# Patient Record
Sex: Female | Born: 1965 | Race: White | Hispanic: No | Marital: Married | Smoking: Current every day smoker
Health system: Southern US, Community
[De-identification: ages and names within clinical notes are randomized; demographics above are authoritative.]

## PROBLEM LIST (undated history)

## (undated) DIAGNOSIS — G43909 Migraine, unspecified, not intractable, without status migrainosus: Secondary | ICD-10-CM

## (undated) HISTORY — PX: ABDOMINAL HYSTERECTOMY: SHX81

## (undated) HISTORY — DX: Migraine, unspecified, not intractable, without status migrainosus: G43.909

---

## 1992-03-24 HISTORY — PX: KNEE ARTHROSCOPY: SUR90

## 2014-10-04 ENCOUNTER — Ambulatory Visit (HOSPITAL_COMMUNITY)
Admission: RE | Admit: 2014-10-04 | Discharge: 2014-10-04 | Disposition: A | Discharge status: Home / Self Care | Payer: BLUE CROSS/BLUE SHIELD | Source: Ambulatory Visit | Attending: Family Medicine | Admitting: Family Medicine

## 2014-10-04 ENCOUNTER — Ambulatory Visit (INDEPENDENT_AMBULATORY_CARE_PROVIDER_SITE_OTHER): Payer: BLUE CROSS/BLUE SHIELD | Admitting: Family Medicine

## 2014-10-04 ENCOUNTER — Encounter: Payer: Self-pay | Admitting: Family Medicine

## 2014-10-04 ENCOUNTER — Other Ambulatory Visit: Payer: Self-pay | Admitting: Family Medicine

## 2014-10-04 ENCOUNTER — Encounter (INDEPENDENT_AMBULATORY_CARE_PROVIDER_SITE_OTHER): Payer: Self-pay

## 2014-10-04 VITALS — BP 108/78 | HR 78 | Temp 97.5°F | Ht 65.0 in | Wt 196.0 lb

## 2014-10-04 DIAGNOSIS — Z1231 Encounter for screening mammogram for malignant neoplasm of breast: Secondary | ICD-10-CM | POA: Insufficient documentation

## 2014-10-04 DIAGNOSIS — G43009 Migraine without aura, not intractable, without status migrainosus: Secondary | ICD-10-CM

## 2014-10-04 DIAGNOSIS — Z0000 Encounter for general adult medical examination without abnormal findings: Secondary | ICD-10-CM | POA: Diagnosis not present

## 2014-10-04 DIAGNOSIS — E2839 Other primary ovarian failure: Secondary | ICD-10-CM

## 2014-10-04 DIAGNOSIS — Z23 Encounter for immunization: Secondary | ICD-10-CM

## 2014-10-04 LAB — POCT CBC
GRANULOCYTE PERCENT: 66.3 % (ref 37–80)
HEMATOCRIT: 41.9 % (ref 37.7–47.9)
Hemoglobin: 14.2 g/dL (ref 12.2–16.2)
Lymph, poc: 2.6 (ref 0.6–3.4)
MCH, POC: 30.8 pg (ref 27–31.2)
MCHC: 33.9 g/dL (ref 31.8–35.4)
MCV: 90.8 fL (ref 80–97)
MPV: 8.6 fL (ref 0–99.8)
POC Granulocyte: 6.2 (ref 2–6.9)
POC LYMPH PERCENT: 27.8 %L (ref 10–50)
Platelet Count, POC: 205 10*3/uL (ref 142–424)
RBC: 4.62 M/uL (ref 4.04–5.48)
RDW, POC: 13.1 %
WBC: 9.4 10*3/uL (ref 4.6–10.2)

## 2014-10-04 NOTE — Progress Notes (Signed)
Subjective:  Patient ID: Dana Zamora, female    DOB: 1965/10/02  Age: 49 y.o. MRN: [id number]  CC: Establish Care   HPI [name] presents for 6 mos increasing hot flashes. Now are monsters. Using OTC Amberen. No relief. Gets sweats that wake her and drench her. Feel like she is on fire. Somewhat emotional, but denies depression. Would like treatment guidance. Realizes that hormonal tx is unsafe due to smoking history. Will ing to get mammogram for screening. Apparently hyst left ovary which has recently begun to fail.   Additionally pt. Suffers from migraines. Maxalt is sufficient abortive. HA 2-4 per month. 7/10 pain is throbbing. Frontoparietal. Some photophobia and phonophobia without nausea.Past w/u neg for intracranial lesion. Not taking preventive. Has tried a couple of meds. Not sure of name but has not tried topiramate.   History [name] has a past medical history of Migraine.   She has past surgical history that includes Abdominal hysterectomy and Knee arthroscopy (Left, 03/24/1992).   Her family history includes Cancer in her mother.She reports that she has been smoking Cigarettes.  She started smoking about 30 years ago. She has been smoking about 0.50 packs per day. She does not have any smokeless tobacco history on file. She reports that she drinks alcohol. She reports that she does not use illicit drugs.    ROS Review of Systems  Constitutional: Negative for fever, chills, diaphoresis, appetite change, fatigue and unexpected weight change.  HENT: Negative for congestion, ear pain, hearing loss, postnasal drip, rhinorrhea, sneezing, sore throat and trouble swallowing.   Eyes: Negative for pain.  Respiratory: Negative for cough, chest tightness and shortness of breath.   Cardiovascular: Negative for chest pain and palpitations.  Gastrointestinal: Negative for nausea, vomiting, abdominal pain, diarrhea, constipation and blood in stool.  Genitourinary: Positive for  menstrual problem. Negative for dysuria, urgency, frequency, vaginal bleeding, vaginal discharge, pelvic pain and dyspareunia.  Musculoskeletal: Negative for joint swelling and arthralgias.  Skin: Negative for rash.  Allergic/Immunologic: Negative for environmental allergies, food allergies and immunocompromised state.  Neurological: Negative for dizziness, weakness, numbness and headaches.  Hematological: Negative for adenopathy. Does not bruise/bleed easily.  Psychiatric/Behavioral: Negative for dysphoric mood, decreased concentration and agitation. The patient is not nervous/anxious.     Objective:  BP 108/78 mmHg  Pulse 78  Temp(Src) 97.5 F (36.4 C) (Oral)  Ht 5' 5"  (1.651 m)  Wt 196 lb (88.905 kg)  BMI 32.62 kg/m2  Physical Exam  Constitutional: She is oriented to person, place, and time. She appears well-developed and well-nourished. No distress.  HENT:  Head: Normocephalic and atraumatic.  Right Ear: External ear normal.  Left Ear: External ear normal.  Nose: Nose normal.  Mouth/Throat: Oropharynx is clear and moist.  Eyes: Conjunctivae and EOM are normal. Pupils are equal, round, and reactive to light.  Neck: Normal range of motion. Neck supple. No thyromegaly present.  Cardiovascular: Normal rate, regular rhythm, normal heart sounds and intact distal pulses.  Exam reveals no gallop and no friction rub.   No murmur heard. Pulmonary/Chest: Effort normal and breath sounds normal. No tachypnea. No respiratory distress. She has no wheezes. She has no rhonchi. She has no rales. Right breast exhibits no inverted nipple, no mass, no nipple discharge, no skin change and no tenderness. Left breast exhibits no inverted nipple, no mass, no nipple discharge, no skin change and no tenderness. Breasts are symmetrical.  Abdominal: Soft. Bowel sounds are normal. She exhibits no distension and no mass. There is no  tenderness. There is no rebound and no guarding.  Musculoskeletal: Normal range  of motion. She exhibits no edema or tenderness.  Lymphadenopathy:    She has no cervical adenopathy.  Neurological: She is alert and oriented to person, place, and time. She has normal reflexes.  Skin: Skin is warm and dry.  Psychiatric: She has a normal mood and affect. Her behavior is normal. Judgment and thought content normal.    Assessment & Plan:   Shelsy was seen today for establish care.  Diagnoses and all orders for this visit:  Wellness examination Orders: -     POCT CBC -     CMP14+EGFR -     Lipid panel -     Thyroid Panel With TSH -     Vit D  25 hydroxy (rtn osteoporosis monitoring)  Menopause ovarian failure  Migraine without aura and without status migrainosus, not intractable  Other orders -     Tdap vaccine greater than or equal to 7yo IM   I am having Ms. Frogge maintain her rizatriptan and topiramate.  Meds ordered this encounter  Medications  . rizatriptan (MAXALT) 10 MG tablet    Sig: Take 10 mg by mouth as needed for migraine. May repeat in 2 hours if needed  . topiramate (TOPAMAX) 25 MG tablet    Sig: Take 25 mg by mouth 2 (two) times daily.   Pt. Was counseled regarding smoking cessation. Pharmacologic and behavioral techniques reviewed. Risks to health, specifically to breast cancer, but also heart disease, COPD, CA reviewed.  Auto safety including seat belt use & driving after drinking reviewed.  Proper diet & sleep plus OTC menopause meds discussed.  Follow-up: Return in about 3 months (around 01/04/2015).  Claretta Fraise, M.D.

## 2014-10-05 LAB — LIPID PANEL
CHOLESTEROL TOTAL: 283 mg/dL — AB (ref 100–199)
Chol/HDL Ratio: 5 ratio units — ABNORMAL HIGH (ref 0.0–4.4)
HDL: 57 mg/dL (ref >39)
LDL Calculated: 200 mg/dL — ABNORMAL HIGH (ref 0–99)
Triglycerides: 128 mg/dL (ref 0–149)
VLDL CHOLESTEROL CAL: 26 mg/dL (ref 5–40)

## 2014-10-05 LAB — CMP14+EGFR
ALT: 17 IU/L (ref 0–32)
AST: 16 IU/L (ref 0–40)
Albumin/Globulin Ratio: 2 (ref 1.1–2.5)
Albumin: 4.3 g/dL (ref 3.5–5.5)
Alkaline Phosphatase: 50 IU/L (ref 39–117)
BUN / CREAT RATIO: 22 (ref 9–23)
BUN: 19 mg/dL (ref 6–24)
Bilirubin Total: 0.3 mg/dL (ref 0.0–1.2)
CO2: 20 mmol/L (ref 18–29)
Calcium: 9.2 mg/dL (ref 8.7–10.2)
Chloride: 106 mmol/L (ref 97–108)
Creatinine, Ser: 0.87 mg/dL (ref 0.57–1.00)
GFR calc Af Amer: 90 mL/min/{1.73_m2} (ref >59)
GFR, EST NON AFRICAN AMERICAN: 78 mL/min/{1.73_m2} (ref >59)
Globulin, Total: 2.2 g/dL (ref 1.5–4.5)
Glucose: 87 mg/dL (ref 65–99)
Potassium: 4.4 mmol/L (ref 3.5–5.2)
Sodium: 143 mmol/L (ref 134–144)
Total Protein: 6.5 g/dL (ref 6.0–8.5)

## 2014-10-05 LAB — THYROID PANEL WITH TSH
Free Thyroxine Index: 2 (ref 1.2–4.9)
T3 UPTAKE RATIO: 26 % (ref 24–39)
T4 TOTAL: 7.7 ug/dL (ref 4.5–12.0)
TSH: 1.43 u[IU]/mL (ref 0.450–4.500)

## 2014-10-05 LAB — VITAMIN D 25 HYDROXY (VIT D DEFICIENCY, FRACTURES): Vit D, 25-Hydroxy: 23.9 ng/mL — ABNORMAL LOW (ref 30.0–100.0)

## 2014-10-06 ENCOUNTER — Telehealth: Payer: Self-pay | Admitting: *Deleted

## 2014-10-06 MED ORDER — ATORVASTATIN CALCIUM 40 MG PO TABS: 40.0000 mg | ORAL_TABLET | Freq: Every day | ORAL | Status: DC

## 2014-10-06 NOTE — Telephone Encounter (Signed)
-----   Message from Mechele ClaudeWarren Stacks, MD sent at 10/05/2014 12:26 PM EDT ----- Your cholesterol is too high. It would benefit from medication. Without treatment, risk for heart attack, stroke and other medical conditions is high. I recommend atorvastatin. 40 mg daily with supper should reduce your risk significantly.  Nurse: please send in a prescription to pt.s pharmacy, if they are willing, for atorvastatin 40 mg. One daily.  6 mos supply.  Make sure they are set up for a followup in 6 mos. Ask them to come 2 days early for fasting NMR and CMP.

## 2014-10-06 NOTE — Telephone Encounter (Signed)
Pt notified of results Verbalizes understanding 

## 2014-10-20 ENCOUNTER — Telehealth: Payer: Self-pay | Admitting: Family Medicine

## 2014-10-20 MED ORDER — CONJ ESTROG-MEDROXYPROGEST ACE 0.3-1.5 MG PO TABS: 1.0000 | ORAL_TABLET | Freq: Every day | ORAL | Status: DC

## 2014-10-20 NOTE — Telephone Encounter (Signed)
Done, tell pt. 

## 2014-10-20 NOTE — Telephone Encounter (Signed)
Please advise and route to Pool B 

## 2014-10-24 NOTE — Telephone Encounter (Signed)
Detailed Message left.

## 2014-12-11 ENCOUNTER — Other Ambulatory Visit: Payer: Self-pay

## 2014-12-11 MED ORDER — CONJ ESTROG-MEDROXYPROGEST ACE 0.3-1.5 MG PO TABS: 1.0000 | ORAL_TABLET | Freq: Every day | ORAL | Status: DC

## 2015-03-14 ENCOUNTER — Other Ambulatory Visit: Payer: Self-pay | Admitting: Family Medicine

## 2015-05-07 ENCOUNTER — Other Ambulatory Visit: Payer: Self-pay | Admitting: Family Medicine

## 2015-05-08 NOTE — Telephone Encounter (Signed)
Last seen 10/04/14  Dr Darlyn Read

## 2016-03-10 ENCOUNTER — Ambulatory Visit (INDEPENDENT_AMBULATORY_CARE_PROVIDER_SITE_OTHER): Payer: No Typology Code available for payment source | Admitting: Family Medicine

## 2016-03-10 ENCOUNTER — Encounter: Payer: Self-pay | Admitting: Family Medicine

## 2016-03-10 VITALS — BP 133/92 | HR 81 | Temp 97.7°F | Ht 65.0 in | Wt 200.0 lb

## 2016-03-10 DIAGNOSIS — J0100 Acute maxillary sinusitis, unspecified: Secondary | ICD-10-CM

## 2016-03-10 MED ORDER — BETAMETHASONE SOD PHOS & ACET 6 (3-3) MG/ML IJ SUSP
6.0000 mg | Freq: Once | INTRAMUSCULAR | Status: AC
  Administered 2016-03-10: 6 mg via INTRAMUSCULAR

## 2016-03-10 MED ORDER — PSEUDOEPHEDRINE-GUAIFENESIN ER 120-1200 MG PO TB12: 1.0000 | ORAL_TABLET | Freq: Two times a day (BID) | ORAL | 0 refills | Status: DC

## 2016-03-10 MED ORDER — AMOXICILLIN-POT CLAVULANATE 875-125 MG PO TABS: 1.0000 | ORAL_TABLET | Freq: Two times a day (BID) | ORAL | 0 refills | Status: DC

## 2016-03-10 NOTE — Progress Notes (Signed)
Subjective:  Patient ID: Dana Zamora, female    DOB: [birth year]  Age: 50 y.o. MRN: [id number]  CC: Nasal Congestion (pt here today c/o runny nose, head stopped up)   HPI Dana Zamora presents for Symptoms include congestion, facial pain, nasal congestion, no  fever, non productive cough, post nasal drip and sinus pressure with no fever, chills, night sweats or weight loss. Onset of symptoms was a 3 weeks ago, gradually worsening in spite of use of mucinex & sudafed  History Dana Zamora has a past medical history of Migraine.   She has a past surgical history that includes Abdominal hysterectomy and Knee arthroscopy (Left, 03/24/1992).   Her family history includes Cancer in her mother.She reports that she has been smoking Cigarettes.  She started smoking about 31 years ago. She has been smoking about 0.50 packs per day. She has never used smokeless tobacco. She reports that she drinks alcohol. She reports that she does not use drugs.  Current Outpatient Prescriptions on File Prior to Visit  Medication Sig Dispense Refill  . rizatriptan (MAXALT) 10 MG tablet Take 10 mg by mouth as needed for migraine. May repeat in 2 hours if needed    . topiramate (TOPAMAX) 25 MG tablet Take 25 mg by mouth 2 (two) times daily.     No current facility-administered medications on file prior to visit.     ROS Review of Systems  Constitutional: Negative for activity change, appetite change, chills and fever.  HENT: Positive for congestion, postnasal drip, rhinorrhea and sinus pressure. Negative for ear discharge, ear pain, hearing loss, nosebleeds, sneezing and trouble swallowing.   Respiratory: Negative for chest tightness and shortness of breath.   Cardiovascular: Negative for chest pain and palpitations.  Skin: Negative for rash.    Objective:  BP (!) 133/92   Pulse 81   Temp 97.7 F (36.5 C) (Oral)   Ht 5\' 5"  (1.651 m)   Wt 200 lb (90.7 kg)   BMI 33.28 kg/m   Physical Exam    Constitutional: She appears well-developed and well-nourished.  HENT:  Head: Normocephalic and atraumatic.  Right Ear: Tympanic membrane and external ear normal. No decreased hearing is noted.  Left Ear: Tympanic membrane and external ear normal. No decreased hearing is noted.  Nose: Mucosal edema present. Right sinus exhibits no frontal sinus tenderness. Left sinus exhibits no frontal sinus tenderness.  Mouth/Throat: No oropharyngeal exudate or posterior oropharyngeal erythema.  Neck: No Brudzinski's sign noted.  Pulmonary/Chest: Breath sounds normal. No respiratory distress.  Lymphadenopathy:       Head (right side): No preauricular adenopathy present.       Head (left side): No preauricular adenopathy present.       Right cervical: No superficial cervical adenopathy present.      Left cervical: No superficial cervical adenopathy present.    Assessment & Plan:   Dana Zamora was seen today for nasal congestion.  Diagnoses and all orders for this visit:  Acute maxillary sinusitis, recurrence not specified -     betamethasone acetate-betamethasone sodium phosphate (CELESTONE) injection 6 mg; Inject 1 mL (6 mg total) into the muscle once.  Other orders -     amoxicillin-clavulanate (AUGMENTIN) 875-125 MG tablet; Take 1 tablet by mouth 2 (two) times daily. Take all of this medication -     Pseudoephedrine-Guaifenesin [phone] MG TB12; Take 1 tablet by mouth 2 (two) times daily. For congestion   I have discontinued Ms. Steinke's atorvastatin and PREMPRO. I am also having her start  on amoxicillin-clavulanate and Pseudoephedrine-Guaifenesin. Additionally, I am having her maintain her rizatriptan and topiramate. We will continue to administer betamethasone acetate-betamethasone sodium phosphate.  Meds ordered this encounter  Medications  . amoxicillin-clavulanate (AUGMENTIN) 875-125 MG tablet    Sig: Take 1 tablet by mouth 2 (two) times daily. Take all of this medication    Dispense:  20  tablet    Refill:  0  . Pseudoephedrine-Guaifenesin [phone] MG TB12    Sig: Take 1 tablet by mouth 2 (two) times daily. For congestion    Dispense:  20 each    Refill:  0  . betamethasone acetate-betamethasone sodium phosphate (CELESTONE) injection 6 mg     Follow-up: Return if symptoms worsen or fail to improve.  Mechele ClaudeWarren Laretha Luepke, M.D.

## 2016-05-16 ENCOUNTER — Ambulatory Visit (INDEPENDENT_AMBULATORY_CARE_PROVIDER_SITE_OTHER): Payer: No Typology Code available for payment source | Admitting: Family Medicine

## 2016-05-16 ENCOUNTER — Encounter: Payer: Self-pay | Admitting: Family Medicine

## 2016-05-16 VITALS — BP 125/85 | HR 96 | Temp 98.7°F | Ht 65.0 in | Wt 199.4 lb

## 2016-05-16 DIAGNOSIS — R6889 Other general symptoms and signs: Secondary | ICD-10-CM | POA: Diagnosis not present

## 2016-05-16 LAB — VERITOR FLU A/B WAIVED
INFLUENZA B: NEGATIVE
Influenza A: NEGATIVE

## 2016-05-16 MED ORDER — OSELTAMIVIR PHOSPHATE 75 MG PO CAPS: 75.0000 mg | ORAL_CAPSULE | Freq: Two times a day (BID) | ORAL | 0 refills | Status: AC

## 2016-05-16 NOTE — Progress Notes (Signed)
BP 125/85   Pulse 96   Temp 98.7 F (37.1 C) (Oral)   Ht 5\' 5"  (1.651 m)   Wt 199 lb 6.4 oz (90.4 kg)   BMI 33.18 kg/m    Subjective:    Patient ID: Dana Zamora, female    DOB: 09/25/1965, 51 y.o.   MRN: [id number]  HPI: Dana Zamora is a 51 y.o. female presenting on 05/16/2016 for Nasal Congestion; Generalized Body Aches; and Fever   HPI Cough and congestion and body aches and fever Patient has been having cough and congestion and body aches and fever this been going on since last night. She denies any shortness of breath or wheezing. Her cough has been mostly nonproductive at this point. She does have some nasal congestion and sinus pressure as well. She has not really used anything over-the-counter for this just yet except Tylenol for fever. She says her fever was low-grade at 99 last night. 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  Relevant past medical, surgical, family and social history reviewed and updated as indicated. Interim medical history since our last visit reviewed. Allergies and medications reviewed and updated.  Review of Systems  Constitutional: Positive for chills and fever.  HENT: Positive for congestion, postnasal drip, rhinorrhea, sinus pressure and sore throat. Negative for ear discharge, ear pain and sneezing.   Eyes: Negative for pain, redness and visual disturbance.  Respiratory: Positive for cough. Negative for chest tightness and shortness of breath.   Cardiovascular: Negative for chest pain and leg swelling.  Genitourinary: Negative for difficulty urinating and dysuria.  Musculoskeletal: Positive for myalgias. Negative for back pain and gait problem.  Skin: Negative for rash.  Neurological: Negative for light-headedness and headaches.  Psychiatric/Behavioral: Negative for agitation and behavioral problems.  All other systems reviewed and are negative.   Per HPI unless  specifically indicated above   Allergies as of 05/16/2016   No Known Allergies     Medication List       Accurate as of 05/16/16  5:57 PM. Always use your most recent med list.          oseltamivir 75 MG capsule Commonly known as:  TAMIFLU Take 1 capsule (75 mg total) by mouth 2 (two) times daily.   rizatriptan 10 MG tablet Commonly known as:  MAXALT Take 10 mg by mouth as needed for migraine. May repeat in 2 hours if needed   topiramate 25 MG tablet Commonly known as:  TOPAMAX Take 25 mg by mouth 2 (two) times daily.          Objective:    BP 125/85   Pulse 96   Temp 98.7 F (37.1 C) (Oral)   Ht 5\' 5"  (1.651 m)   Wt 199 lb 6.4 oz (90.4 kg)   BMI 33.18 kg/m   Wt Readings from Last 3 Encounters:  05/16/16 199 lb 6.4 oz (90.4 kg)  03/10/16 200 lb (90.7 kg)  10/04/14 196 lb (88.9 kg)    Physical Exam  Constitutional: She is oriented to person, place, and time. She appears well-developed and well-nourished. No distress.  HENT:  Right Ear: Tympanic membrane, external ear and ear canal normal.  Left Ear: Tympanic membrane, external ear and ear canal normal.  Nose: Mucosal edema and rhinorrhea present. No epistaxis. Right sinus exhibits no maxillary sinus tenderness and no frontal sinus tenderness. Left sinus exhibits no maxillary sinus tenderness and no frontal  sinus tenderness.  Mouth/Throat: Uvula is midline and mucous membranes are normal. Posterior oropharyngeal edema and posterior oropharyngeal erythema present. No oropharyngeal exudate or tonsillar abscesses.  Eyes: Conjunctivae are normal.  Cardiovascular: Normal rate, regular rhythm, normal heart sounds and intact distal pulses.   No murmur heard. Pulmonary/Chest: Effort normal and breath sounds normal. No respiratory distress. She has no wheezes. She has no rales.  Musculoskeletal: Normal range of motion. She exhibits no edema or tenderness.  Lymphadenopathy:    She has no cervical adenopathy.    Neurological: She is alert and oriented to person, place, and time. Coordination normal.  Skin: Skin is warm and dry. No rash noted. She is not diaphoretic.  Psychiatric: She has a normal mood and affect. Her behavior is normal.  Vitals reviewed.   Influenza: Negative    Assessment & Plan:   Problem List Items Addressed This Visit    None    Visit Diagnoses    Flu-like symptoms    -  Primary   Relevant Medications   oseltamivir (TAMIFLU) 75 MG capsule   Other Relevant Orders   Veritor Flu A/B Waived     We'll treat like flu because of prevalence in the community  Follow up plan: Return if symptoms worsen or fail to improve.  Counseling provided for all of the vaccine components Orders Placed This Encounter  Procedures  . Veritor Flu A/B Waived    Arville CareJoshua Dettinger, MD [facility] 05/16/2016, 5:57 PM

## 2016-07-04 IMAGING — MG MM DIGITAL SCREENING BILAT W/ CAD
4 series · 4 of 4 positions shown · non-contrast
Comparison: Previous exam(s).

CLINICAL DATA: Screening.

EXAM:
DIGITAL SCREENING BILATERAL MAMMOGRAM WITH CAD

[R CC]
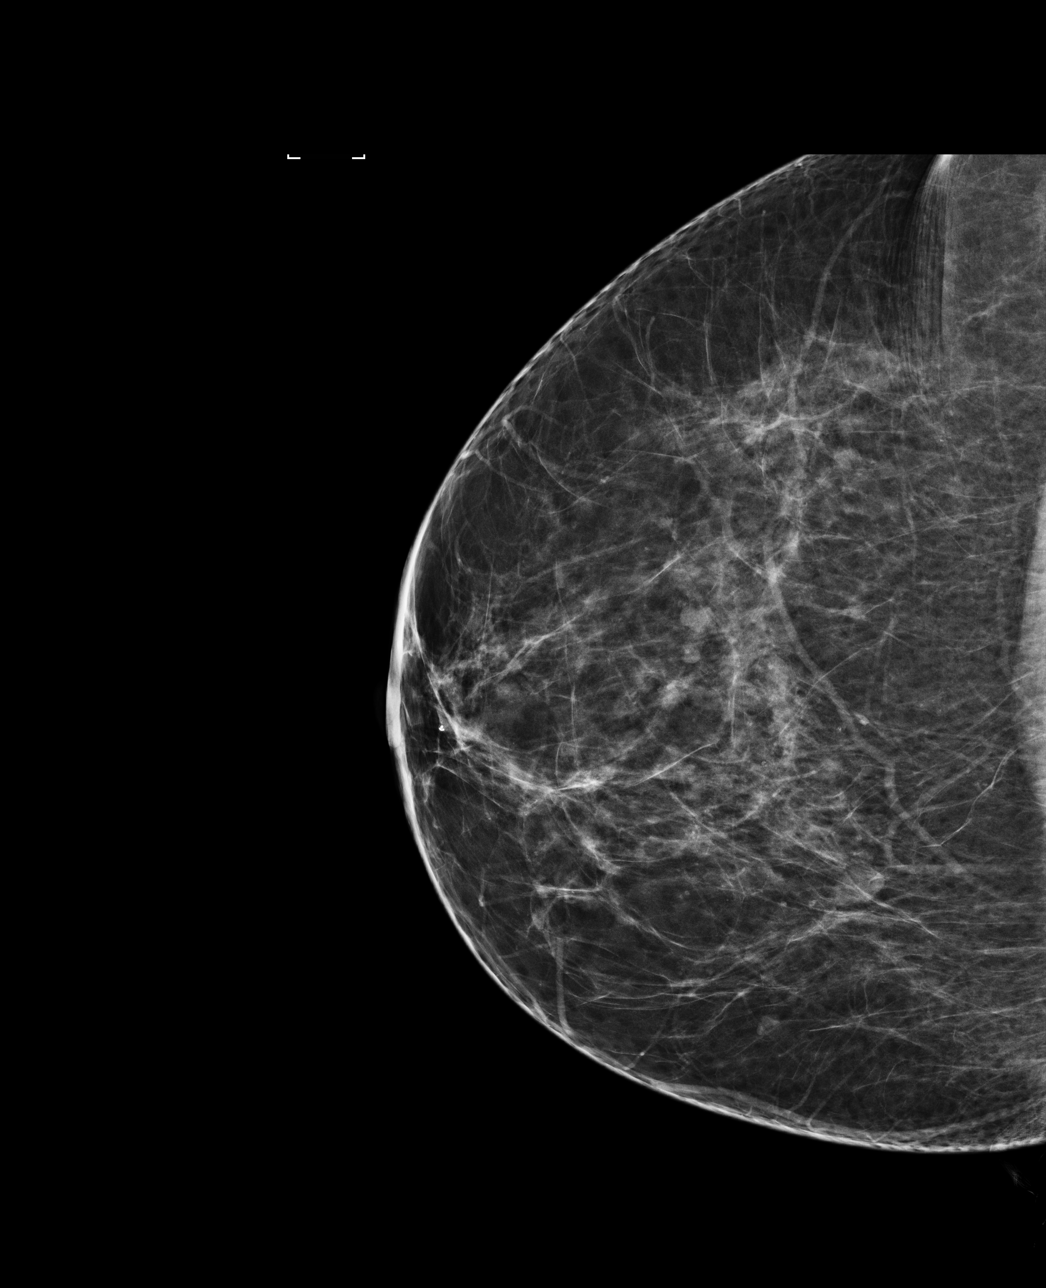

[L MLO]
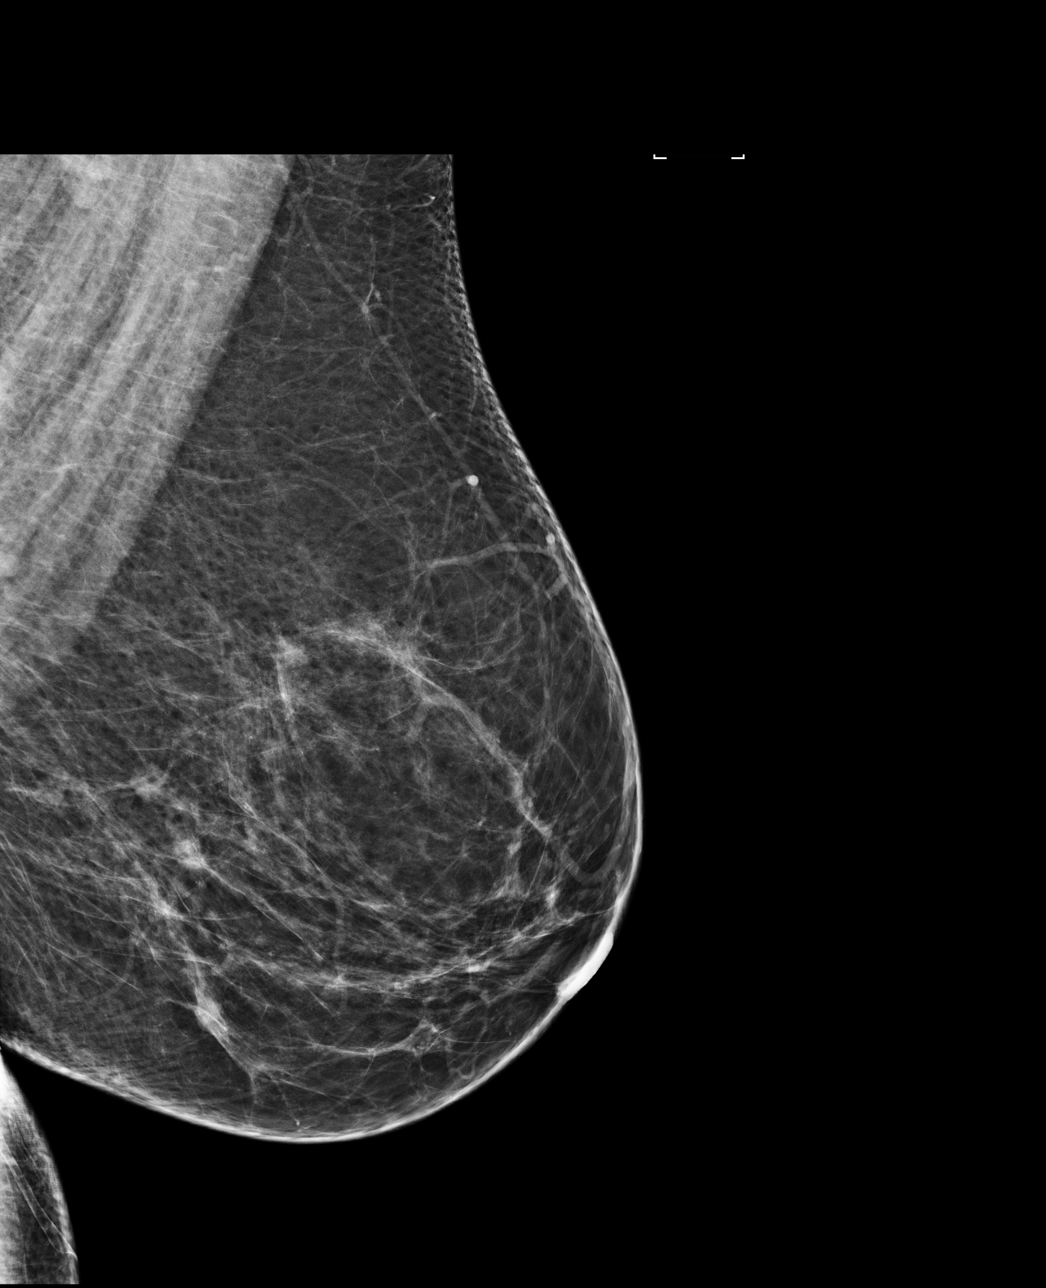

[L CC]
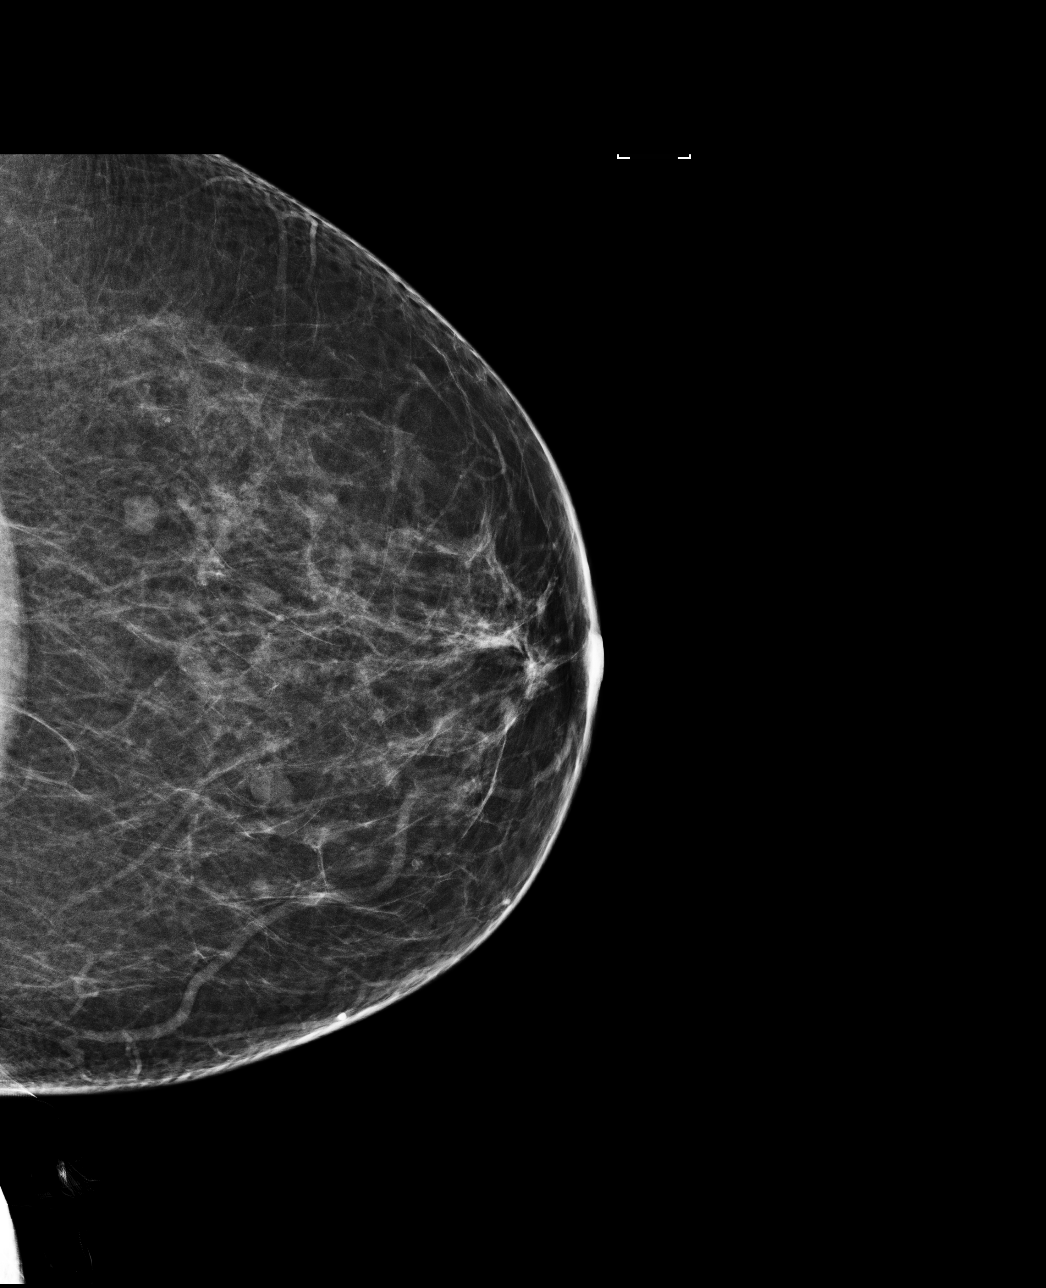

[R MLO]
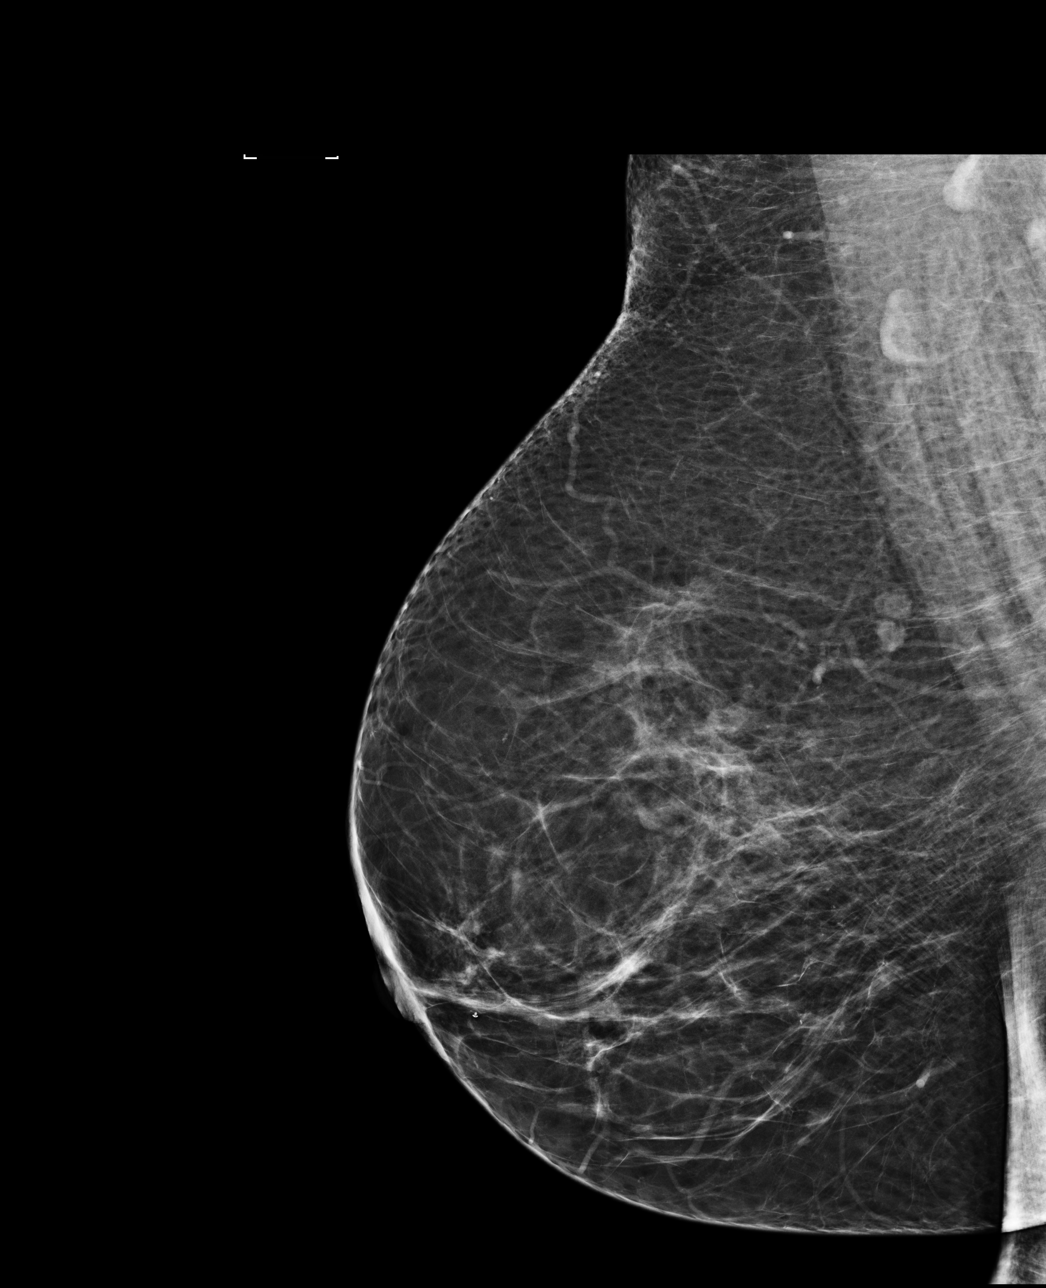

[4 of 4 positions shown; findings below may reference images not displayed]

ACR Breast Density Category b: There are scattered areas of
fibroglandular density.
FINDINGS: There are no findings suspicious for malignancy. Images were
processed with CAD.
IMPRESSION: No mammographic evidence of malignancy. A result letter of this
screening mammogram will be mailed directly to the patient.

RECOMMENDATION:
Screening mammogram in one year. (Code:AS-G-LCT)

BI-RADS CATEGORY  1: Negative.

## 2024-02-02 ENCOUNTER — Encounter (HOSPITAL_BASED_OUTPATIENT_CLINIC_OR_DEPARTMENT_OTHER): Payer: Self-pay

## 2024-02-03 ENCOUNTER — Other Ambulatory Visit (HOSPITAL_BASED_OUTPATIENT_CLINIC_OR_DEPARTMENT_OTHER): Payer: Self-pay

## 2024-02-03 ENCOUNTER — Ambulatory Visit: Attending: Cardiology

## 2024-02-03 ENCOUNTER — Ambulatory Visit (INDEPENDENT_AMBULATORY_CARE_PROVIDER_SITE_OTHER): Admitting: Cardiology

## 2024-02-03 ENCOUNTER — Encounter (HOSPITAL_BASED_OUTPATIENT_CLINIC_OR_DEPARTMENT_OTHER): Payer: Self-pay | Admitting: Cardiology

## 2024-02-03 VITALS — BP 118/82 | HR 67 | Ht 65.0 in | Wt 217.5 lb

## 2024-02-03 DIAGNOSIS — R072 Precordial pain: Secondary | ICD-10-CM | POA: Diagnosis not present

## 2024-02-03 DIAGNOSIS — R002 Palpitations: Secondary | ICD-10-CM

## 2024-02-03 DIAGNOSIS — Z7189 Other specified counseling: Secondary | ICD-10-CM

## 2024-02-03 DIAGNOSIS — F17200 Nicotine dependence, unspecified, uncomplicated: Secondary | ICD-10-CM | POA: Diagnosis not present

## 2024-02-03 DIAGNOSIS — Z8249 Family history of ischemic heart disease and other diseases of the circulatory system: Secondary | ICD-10-CM

## 2024-02-03 DIAGNOSIS — E78011 Heterozygous familial hypercholesterolemia (hefh): Secondary | ICD-10-CM | POA: Diagnosis not present

## 2024-02-03 DIAGNOSIS — Z716 Tobacco abuse counseling: Secondary | ICD-10-CM

## 2024-02-03 DIAGNOSIS — F17210 Nicotine dependence, cigarettes, uncomplicated: Secondary | ICD-10-CM

## 2024-02-03 MED ORDER — METOPROLOL TARTRATE 25 MG PO TABS
25.0000 mg | ORAL_TABLET | Freq: Once | ORAL | 0 refills | Status: DC
  Filled 2024-02-03: qty 1, 1d supply, fill #0

## 2024-02-03 NOTE — Patient Instructions (Addendum)
 Medication Instructions:  No changes today *If you need a refill on your cardiac medications before your next appointment, please call your pharmacy*  Lab Work: none If you have labs (blood work) drawn today and your tests are completely normal, you will receive your results only by: MyChart Message (if you have MyChart) OR A paper copy in the mail If you have any lab test that is abnormal or we need to change your treatment, we will call you to review the results.  Testing/Procedures: Coronary CT Angiogram - see instructions below  14 day Zio Heart Monitor - see instructions below  Follow-Up: At [facility], you and your health needs are our priority.  As part of our continuing mission to provide you with exceptional heart care, our providers are all part of one team.  This team includes your primary Cardiologist (physician) and Advanced Practice Providers or APPs (Physician Assistants and Nurse Practitioners) who all work together to provide you with the care you need, when you need it.  Your next appointment:   2 month(s)  Provider:   Shelda Bruckner, MD     Other Instructions   Your cardiac CT will be scheduled at the below locations: [facility] [address]  [place], [place] [address]  --Heart and V[facility] Tower at [employer]-- please enter the parking lot using the [address] entrance and use the FREE valet service at the patient drop-off area. Enter the building and check-in with registration on the main floor.   Please follow these instructions carefully (unless otherwise directed):  An IV will be required for this test and Nitroglycerin will be given.   On the Night Before the Test: Be sure to Drink plenty of water. Do not consume any caffeinated/decaffeinated beverages or chocolate 12 hours prior to your test. Do not take any antihistamines 12 hours prior to your test.  On the Day of the Test: Drink  plenty of water until 1 hour prior to the test. Do not eat any food 1 hour prior to test. You may take your regular medications prior to the test.  Take metoprolol (Lopressor) two hours prior to test. Patients who wear a continuous glucose monitor MUST remove the device prior to scanning. FEMALES- please wear underwire-free bra if available, avoid dresses & tight clothing      After the Test: Drink plenty of water. After receiving IV contrast, you may experience a mild flushed feeling. This is normal. On occasion, you may experience a mild rash up to 24 hours after the test. This is not dangerous. If this occurs, you can take Benadryl 25 mg, Zyrtec, Claritin, or Allegra and increase your fluid intake. (Patients taking Tikosyn should avoid Benadryl, and may take Zyrtec, Claritin, or Allegra) If you experience trouble breathing, this can be serious. If it is severe call [phone] IMMEDIATELY. If it is mild, please call our office.  We will call to schedule your test 2-4 weeks out understanding that some insurance companies will need an authorization prior to the service being performed.   For more information and frequently asked questions, please visit our website : [email or web address]  For non-scheduling related questions, please contact the cardiac imaging nurse navigator should you have any questions/concerns: Cardiac Imaging Nurse Navigators Direct Office Dial: [phone]   For scheduling needs, including cancellations and rescheduling, please call Brittany, [phone].  ZIO XT- Long Term Monitor Instructions  Your physician has requested you wear a ZIO patch monitor for 14 days.  This is a single patch monitor. [employer] supplies one patch monitor per enrollment. Additional stickers are not available. Please do not apply patch if you will be having a Nuclear Stress Test,  Echocardiogram, Cardiac CT, MRI, or Chest Xray during the period you would be wearing the  monitor. The  patch cannot be worn during these tests. You cannot remove and re-apply the  ZIO XT patch monitor.  Your ZIO patch monitor will be mailed 3 day USPS to your address on file. It may take 3-5 days  to receive your monitor after you have been enrolled.  Once you have received your monitor, please review the enclosed instructions. Your monitor  has already been registered assigning a specific monitor serial # to you.  Billing and Patient Assistance Program Information  We have supplied [employer] with any of your insurance information on file for billing purposes. [employer] offers a sliding scale Patient Assistance Program for patients that do not have  insurance, or whose insurance does not completely cover the cost of the ZIO monitor.  You must apply for the Patient Assistance Program to qualify for this discounted rate.  To apply, please call [employer] at [phone], select option 4, select option 2, ask to apply for  Patient Assistance Program. Meredeth will ask your household income, and how many people  are in your household. They will quote your out-of-pocket cost based on that information.  [employer] will also be able to set up a 72-month, interest-free payment plan if needed.  Applying the monitor Hold abrader disc by orange tab. Rub abrader in 40 strokes over the upper left chest as  indicated in your monitor instructions.  Clean area with 4 enclosed alcohol pads. Let dry.  Apply patch as indicated in monitor instructions. Patch will be placed under collarbone on left  side of chest with arrow pointing upward.  Rub patch adhesive wings for 2 minutes. Remove white label marked 1. Remove the white  label marked 2. Rub patch adhesive wings for 2 additional minutes.  While looking in a mirror, press and release button in center of patch. A small green light will  flash 3-4 times. This will be your only indicator that the monitor has been turned on.  Do not shower for the first 24 hours. You  may shower after the first 24 hours.  Press the button if you feel a symptom. You will hear a small click. Record Date, Time and  Symptom in the Patient Logbook.  When you are ready to remove the patch, follow instructions on the last 2 pages of Patient  Logbook. Stick patch monitor onto the last page of Patient Logbook.  Place Patient Logbook in the blue and white box. Use locking tab on box and tape box closed  securely. The blue and white box has prepaid postage on it. Please place it in the mailbox as  soon as possible. Your physician should have your test results approximately 7 days after the  monitor has been mailed back to [facility].  Call [facility] Customer Care at [phone] if you have questions regarding  your ZIO XT patch monitor. Call them immediately if you see an orange light blinking on your  monitor.  If your monitor falls off in less than 4 days, contact our Monitor department at [phone].  If your monitor becomes loose or falls off after 4 days call [employer] at [phone] for  suggestions on securing your monitor

## 2024-02-03 NOTE — Progress Notes (Unsigned)
 Enrolled for [employer] to mail a ZIO XT long term holter monitor to the patients address on file.

## 2024-02-03 NOTE — Progress Notes (Signed)
 Cardiology Office Note:  .   Date:  02/03/2024  ID:  Dana Zamora, DOB 12-27-1965, MRN [id number] PCP: Zamora Dana Caldron, PA-C  [facility] Providers Cardiologist:  Shelda Bruckner, MD {  History of Present Illness: Dana Zamora is a 58 y.o. female with PMH hyperlipidemia who is seen as a new patient consulation for the evaluation of chest pain at the request of Dana Zamora, Dana Zamora.   Referral from 08/26/23 from Dana Zamora, Dana Zamora reviewed. Notes in media tab. She had ER visit 08/11/23 for chest pain, workup reviewed, unremarkable. At PCP follow up visit 08/20/23, noted that she was still having chest pressure that comes and goes, related to stress. Referred to cardiology for further evaluation.   Today: Here for evaluation for her chest pain. She feels like her heart is racing, with central chest discomfort she describes as a mild cramp. Feels like her heart gets off beat.  Happens sporadically, about once/week or so. Lasts 5-10 seconds or up to 15 minutes. Feels dizzy, short of breath when this happens. Nothing clearly triggers them, better if she lays down/removes herself from people.  Prior cardiac history: seen in 2012 for palpitations, felt to be brief symptomatic ectopy, reassurance given at that time  Risk factors: current smoker, 0.5 ppd since 1986. Alcohol, drinks 2 drinks/night (whiskey mostly). Family history:  I am treating her mother for afib. Father did not have heart issues, had pulmonary fibrosis. Several family members with high cholesterol.  Has been able to do yardwork without symptoms.  ROS: Denies shortness of breath at rest or with normal exertion. No PND, orthopnea, LE edema or unexpected weight gain. No syncope. ROS otherwise negative except as noted.   Studies Reviewed: Dana Zamora    EKG:  EKG Interpretation Date/Time:  Wednesday February 03 2024 09:41:27 EST Ventricular Rate:  70 PR Interval:  122 QRS Duration:  76 QT Interval:  396 QTC  Calculation: 427 R Axis:   64  Text Interpretation: Normal sinus rhythm Normal ECG Confirmed by Bruckner Shelda [phone]) on 02/03/2024 9:55:00 AM    Physical Exam:   VS:  BP 118/82   Pulse 67   Ht 5' 5 (1.651 m)   Wt 217 lb 8 oz (98.7 kg)   SpO2 96%   BMI 36.19 kg/m    Wt Readings from Last 3 Encounters:  02/03/24 217 lb 8 oz (98.7 kg)  05/16/16 199 lb 6.4 oz (90.4 kg)  03/10/16 200 lb (90.7 kg)    GEN: Well nourished, well developed in no acute distress HEENT: Normal, moist mucous membranes NECK: No JVD CARDIAC: regular rhythm, normal S1 and S2, no rubs or gallops. No murmur. VASCULAR: Radial and DP pulses 2+ bilaterally. No carotid bruits RESPIRATORY:  Clear to auscultation without rales, wheezing or rhonchi  ABDOMEN: Soft, non-tender, non-distended MUSCULOSKELETAL:  Ambulates independently SKIN: Warm and dry, no edema NEUROLOGIC:  Alert and oriented x 3. No focal neuro deficits noted. PSYCHIATRIC:  Normal affect    ASSESSMENT AND PLAN: .    Palpitations Chest discomfort -we discussed palpitations, potential etiologies today -will get 2 week zio to evaluate for arrhythmia. Discussed Kardia device if nothing captured on monitor -would get echo if monitor abnormal -reviewed red flag warning signs that need immediate medical attention -discussed options for ischemic evaluation, including exercise treadmill, cardiac CT, or cardiac PET scan. After shared decision making, will pursue coronary CT for further evaluation. Will give one time dose of metoprol tartrate 25 mg 2 hours  before scan.  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  -does have lipids from 2019-2021 that showed LDL 198-214 prior to starting statin  Tobacco abuse counseling: The patient was counseled on tobacco cessation today for 4 minutes.  Counseling  included reviewing the risks of smoking tobacco products, how it impacts the patient's current medical diagnoses and different strategies for quitting.  Pharmacotherapy to aid in tobacco cessation was not prescribed today.   CV risk counseling and prevention -recommend heart healthy/Mediterranean diet, with whole grains, fruits, vegetable, fish, lean meats, nuts, and olive oil. Limit salt. -recommend moderate walking, 3-5 times/week for 30-50 minutes each session. Aim for at least 150 minutes/week. Goal should be pace of 3 miles/hours, or walking 1.5 miles in 30 minutes -recommend avoidance of tobacco products. Avoid excess alcohol. -ASCVD risk score: The 10-year ASCVD risk score (Arnett DK, et al., 2019) is: 5.1%   Values used to calculate the score:     Age: 33 years     Clincally relevant sex: Female     Is Non-Hispanic African American: No     Diabetic: No     Tobacco smoker: Yes     Systolic Blood Pressure: 118 mmHg     Is BP treated: No     HDL Cholesterol: 61 mg/dL     Total Cholesterol: 220 mg/dL    Dispo: 2 months to go over   Signed, Shelda Bruckner, MD   Shelda Bruckner, MD, PhD, [facility]  [facility]  [place]  Heart & Vascular at [facility] at [facility] [address] Dana Zamora, [place] [address] [phone]

## 2024-02-23 ENCOUNTER — Encounter (HOSPITAL_COMMUNITY): Payer: Self-pay

## 2024-02-25 ENCOUNTER — Ambulatory Visit (HOSPITAL_COMMUNITY)
Admission: RE | Admit: 2024-02-25 | Discharge: 2024-02-25 | Disposition: A | Discharge status: Home / Self Care | Source: Ambulatory Visit | Attending: Cardiology | Admitting: Cardiology

## 2024-02-25 DIAGNOSIS — R072 Precordial pain: Secondary | ICD-10-CM | POA: Insufficient documentation

## 2024-02-25 MED ORDER — NITROGLYCERIN 0.4 MG SL SUBL
0.8000 mg | SUBLINGUAL_TABLET | Freq: Once | SUBLINGUAL | Status: AC
  Administered 2024-02-25: 0.8 mg via SUBLINGUAL

## 2024-02-25 MED ORDER — IOHEXOL 350 MG/ML SOLN
100.0000 mL | Freq: Once | INTRAVENOUS | Status: AC | PRN
  Administered 2024-02-25: 100 mL via INTRAVENOUS

## 2024-03-01 ENCOUNTER — Ambulatory Visit (HOSPITAL_BASED_OUTPATIENT_CLINIC_OR_DEPARTMENT_OTHER): Payer: Self-pay | Admitting: Cardiology

## 2024-04-11 DIAGNOSIS — R002 Palpitations: Secondary | ICD-10-CM | POA: Diagnosis not present

## 2024-04-13 NOTE — Progress Notes (Signed)
 " Cardiology Office Note:  .   Date:  04/14/2024  ID:  Dana Zamora, DOB 1965-08-23, MRN [id number] PCP: Willma Rojelio Caldron, PA-C  [facility] Providers Cardiologist:  Shelda Bruckner, MD {  History of Present Illness: Dana Zamora is a 59 y.o. female with PMH hyperlipidemia who is seen for follow up.   She had ER visit 08/11/23 for chest pain, workup reviewed, unremarkable. At PCP follow up visit 08/20/23, noted that she was still having chest pressure that comes and goes, related to stress. Referred to cardiology for further evaluation.   Risk factors: current smoker, 0.5 ppd since 1986. Alcohol, drinks 2 drinks/night (whiskey mostly). Family history:  I am treating her mother for afib. Father did not have heart issues, had pulmonary fibrosis. Several family members with high cholesterol.  Today: Symptoms have been irregular, no change in frequency. Had bad body aches/felt like the flu 3-4 weeks ago, no fever. Had congestion in her chest, went to urgent care, given two inhalers and prednisone. Respiratory panel was negative.   Reviewed her CT, monitor results together today. CT coronary from 03/08/24 with Ca score 0, no significant CAD. There were lung changes concerning for smoking related changes. Monitor showed 19 brief SVT episodes (longest 15 beats), of symptomatic events, three were sinus, three were sinus with SVT, and three were sinus with PACs.  ROS: Denies shortness of breath at rest or with normal exertion. No PND, orthopnea, LE edema or unexpected weight gain. No syncope. ROS otherwise negative except as noted.   Studies Reviewed: SABRA    EKG:       Physical Exam:   VS:  BP 116/64   Pulse 62   Ht 5' 5 (1.651 m)   Wt 214 lb 1.6 oz (97.1 kg)   SpO2 92%   BMI 35.63 kg/m    Wt Readings from Last 3 Encounters:  04/14/24 214 lb 1.6 oz (97.1 kg)  02/03/24 217 lb 8 oz (98.7 kg)  05/16/16 199 lb 6.4 oz (90.4 kg)    GEN: Well nourished, well developed in no  acute distress HEENT: Normal, moist mucous membranes NECK: No JVD CARDIAC: regular rhythm, normal S1 and S2, no rubs or gallops. No murmur. VASCULAR: Radial and DP pulses 2+ bilaterally. No carotid bruits RESPIRATORY:  Clear to auscultation without rales, wheezing or rhonchi  ABDOMEN: Soft, non-tender, non-distended MUSCULOSKELETAL:  Ambulates independently SKIN: Warm and dry, no edema NEUROLOGIC:  Alert and oriented x 3. No focal neuro deficits noted. PSYCHIATRIC:  Normal affect    ASSESSMENT AND PLAN: .    Palpitations Chest discomfort -reviewed test results today -CT very reassuring, chest pain is not from ischemia -monitor with paroxysmal SVT (brief) but this did not always correlate with her symptoms, and SVT is very brief -discussed options for pSVT management. Given brevity, low correlation with symptoms, will not use medication at this time. She will contact me if symptoms worse -reviewed red flag warning signs that need immediate medical attention  Hyperlipidemia Family history of heart disease -on rosuvastatin, would continue long term -lipids 05/15/22 from [place] show Tchol 220, TG 139, HDL 61, LDL 134.  -does have lipids from 2019-2021 that showed LDL 198-214 prior to starting statin, concerning for heterzygous familial hypercholesterolemia, but no significant plaque seen on CT coronary  Tobacco abuse counseling: Discussed previously, she is aware of the recommendation but cannot tolerate Chantix or wellbutrin, patch has not worked.    CV risk counseling and prevention -recommend heart  healthy/Mediterranean diet, with whole grains, fruits, vegetable, fish, lean meats, nuts, and olive oil. Limit salt. -recommend moderate walking, 3-5 times/week for 30-50 minutes each session. Aim for at least 150 minutes/week. Goal should be pace of 3 miles/hours, or walking 1.5 miles in 30 minutes -recommend avoidance of tobacco products. Avoid excess alcohol.  Dispo: I would be happy to  see her back as needed. She will talk to her PCP and make sure her cholesterol is followed, but if she and her PCP need assistance with lipid management, we can see her back for that in the future  Signed, Shelda Bruckner, MD   Shelda Bruckner, MD, PhD, [facility] [place]  [facility]    Heart & Vascular at [facility] at [facility] [address] [place], [place] [address] [phone]   "

## 2024-04-14 ENCOUNTER — Encounter (HOSPITAL_BASED_OUTPATIENT_CLINIC_OR_DEPARTMENT_OTHER): Payer: Self-pay | Admitting: Cardiology

## 2024-04-14 ENCOUNTER — Ambulatory Visit (HOSPITAL_BASED_OUTPATIENT_CLINIC_OR_DEPARTMENT_OTHER): Admitting: Cardiology

## 2024-04-14 VITALS — BP 116/64 | HR 62 | Ht 65.0 in | Wt 214.1 lb

## 2024-04-14 DIAGNOSIS — R0789 Other chest pain: Secondary | ICD-10-CM

## 2024-04-14 DIAGNOSIS — R002 Palpitations: Secondary | ICD-10-CM | POA: Diagnosis not present

## 2024-04-14 DIAGNOSIS — Z8249 Family history of ischemic heart disease and other diseases of the circulatory system: Secondary | ICD-10-CM

## 2024-04-14 DIAGNOSIS — Z712 Person consulting for explanation of examination or test findings: Secondary | ICD-10-CM | POA: Diagnosis not present

## 2024-04-14 DIAGNOSIS — E78011 Heterozygous familial hypercholesterolemia (hefh): Secondary | ICD-10-CM | POA: Diagnosis not present

## 2024-04-14 NOTE — Patient Instructions (Addendum)
 Look into a pulse oximeter or a Kardia mobile if you are interested in checking your heart rate/rhythm at home.   Medication Instructions:  No changes *If you need a refill on your cardiac medications before your next appointment, please call your pharmacy*  Lab Work: none   Testing/Procedures: none  Follow-Up: As needed
